# Patient Record
Sex: Male | Born: 1955 | Race: Black or African American | Hispanic: No | Smoking: Current every day smoker
Health system: Southern US, Community
[De-identification: ages and names within clinical notes are randomized; demographics above are authoritative.]

---

## 2006-12-07 ENCOUNTER — Emergency Department: Payer: Self-pay | Admitting: Emergency Medicine

## 2006-12-31 ENCOUNTER — Emergency Department: Payer: Self-pay | Admitting: Emergency Medicine

## 2008-10-22 IMAGING — CR DG WRIST COMPLETE 3+V*R*
1 series · 4 of 4 positions shown · non-contrast
Comparison: none

REASON FOR EXAM: direct blow at work on wrist
COMMENTS:

PROCEDURE:     DXR - DXR WRIST RT COMP WITH OBLIQUES  - December 07, 2006  [DATE]
RESULT:      Four views of the RIGHT wrist reveal the bones to be adequately
mineralized for age. I do not see evidence of an acute fracture. The
overlying soft tissues are normal in appearance.

[Series 1: view not recorded · 0.17mm/px · 4 of 4 slices shown]
[im 1/4]
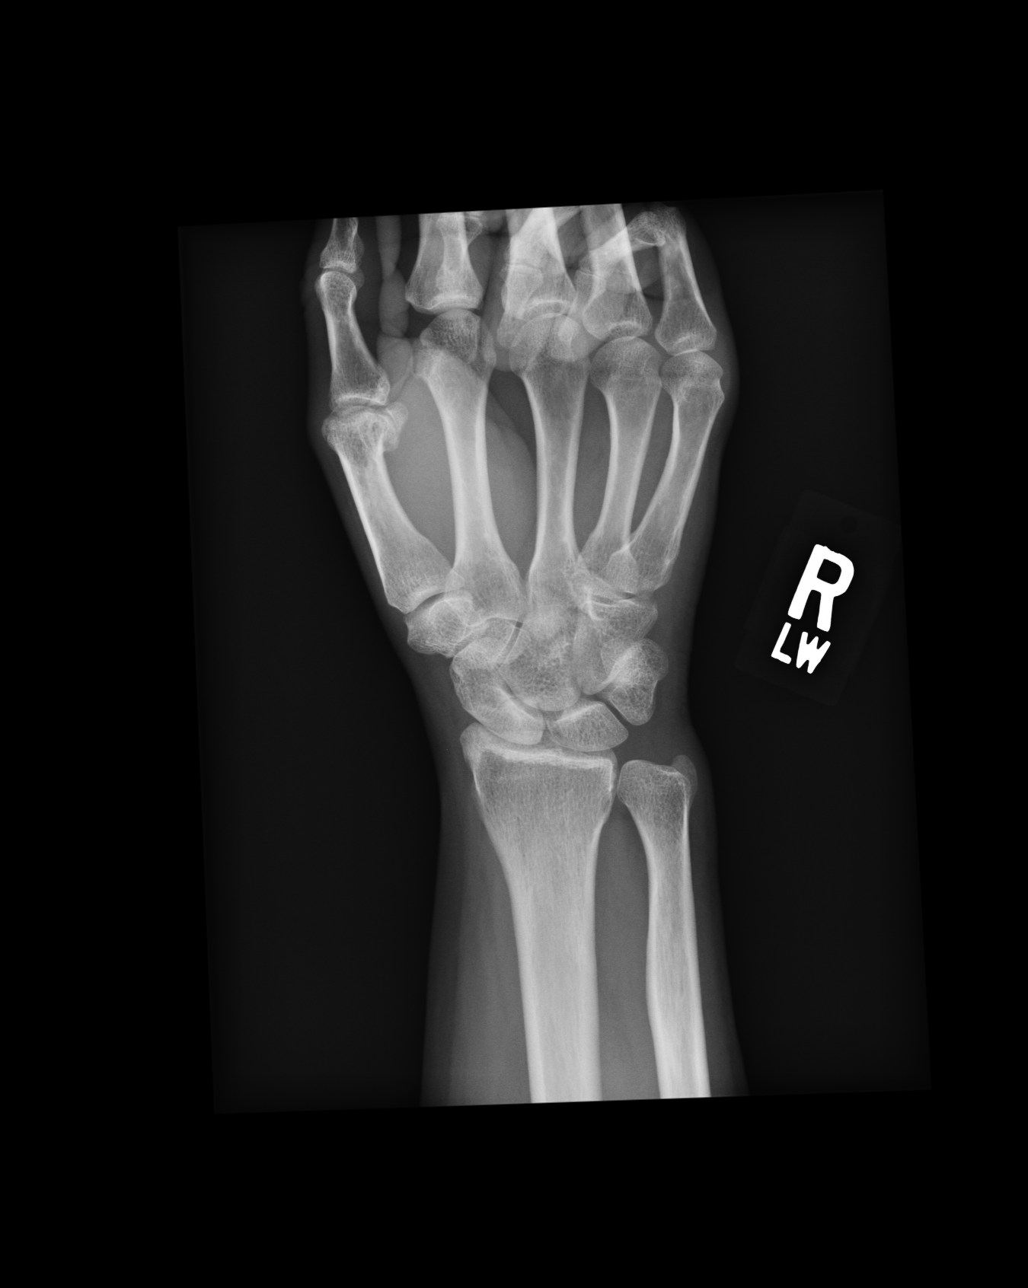
[im 2/4]
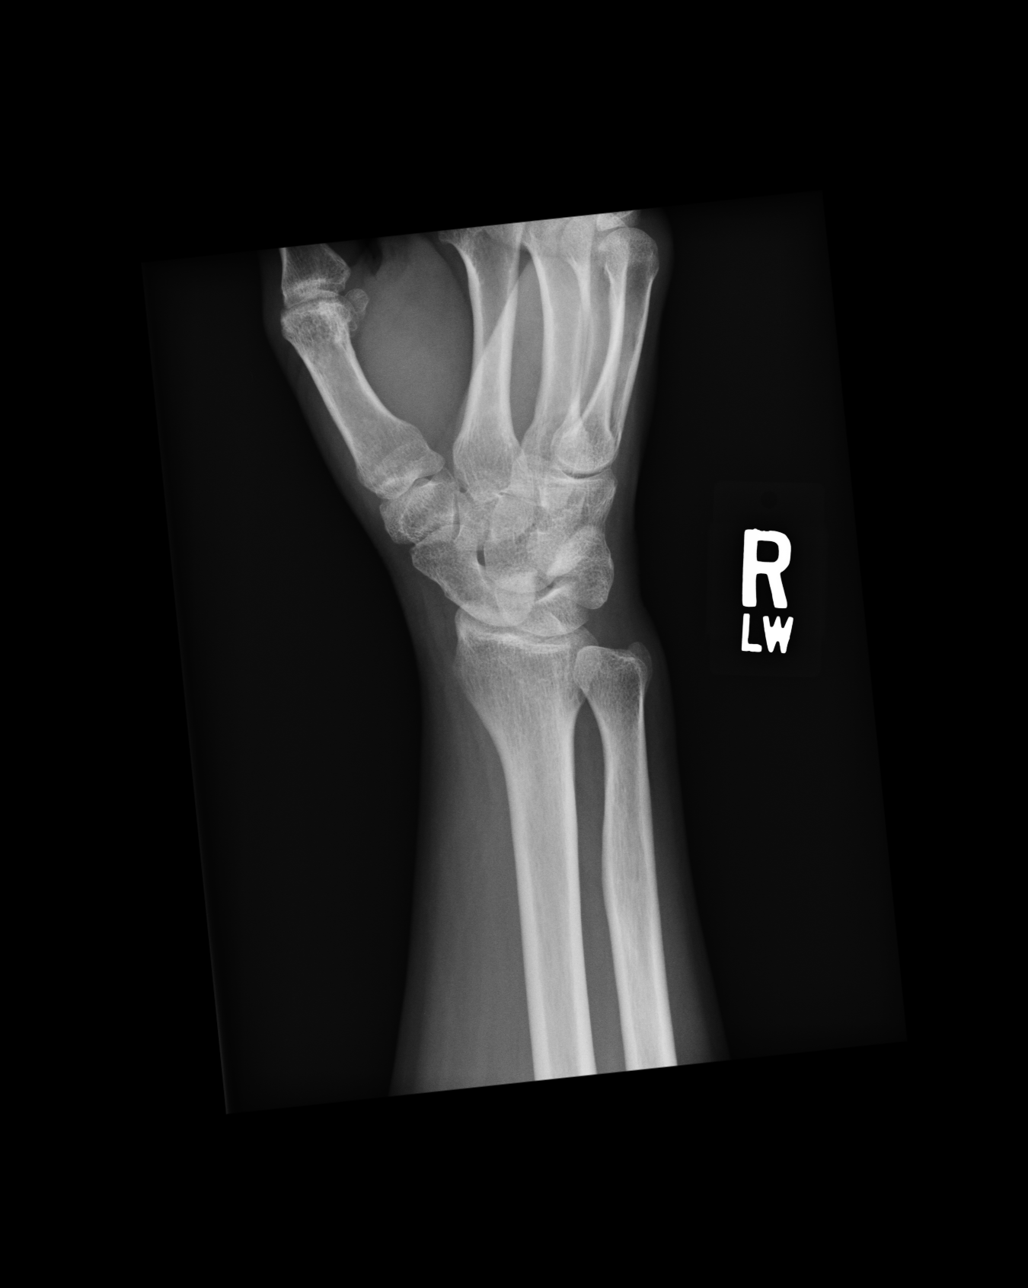
[im 3/4]
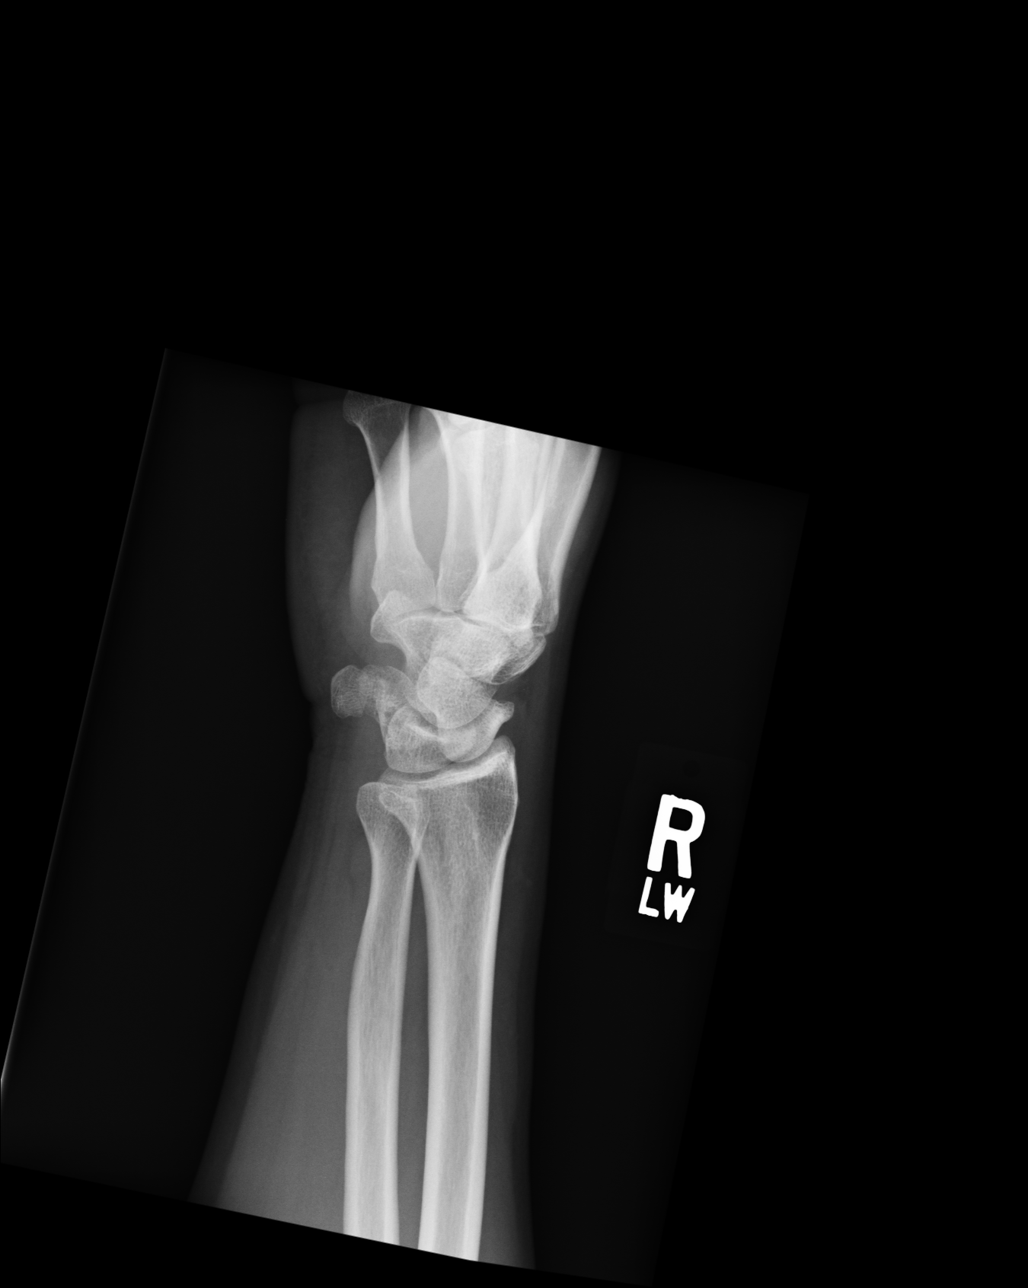
[im 4/4]
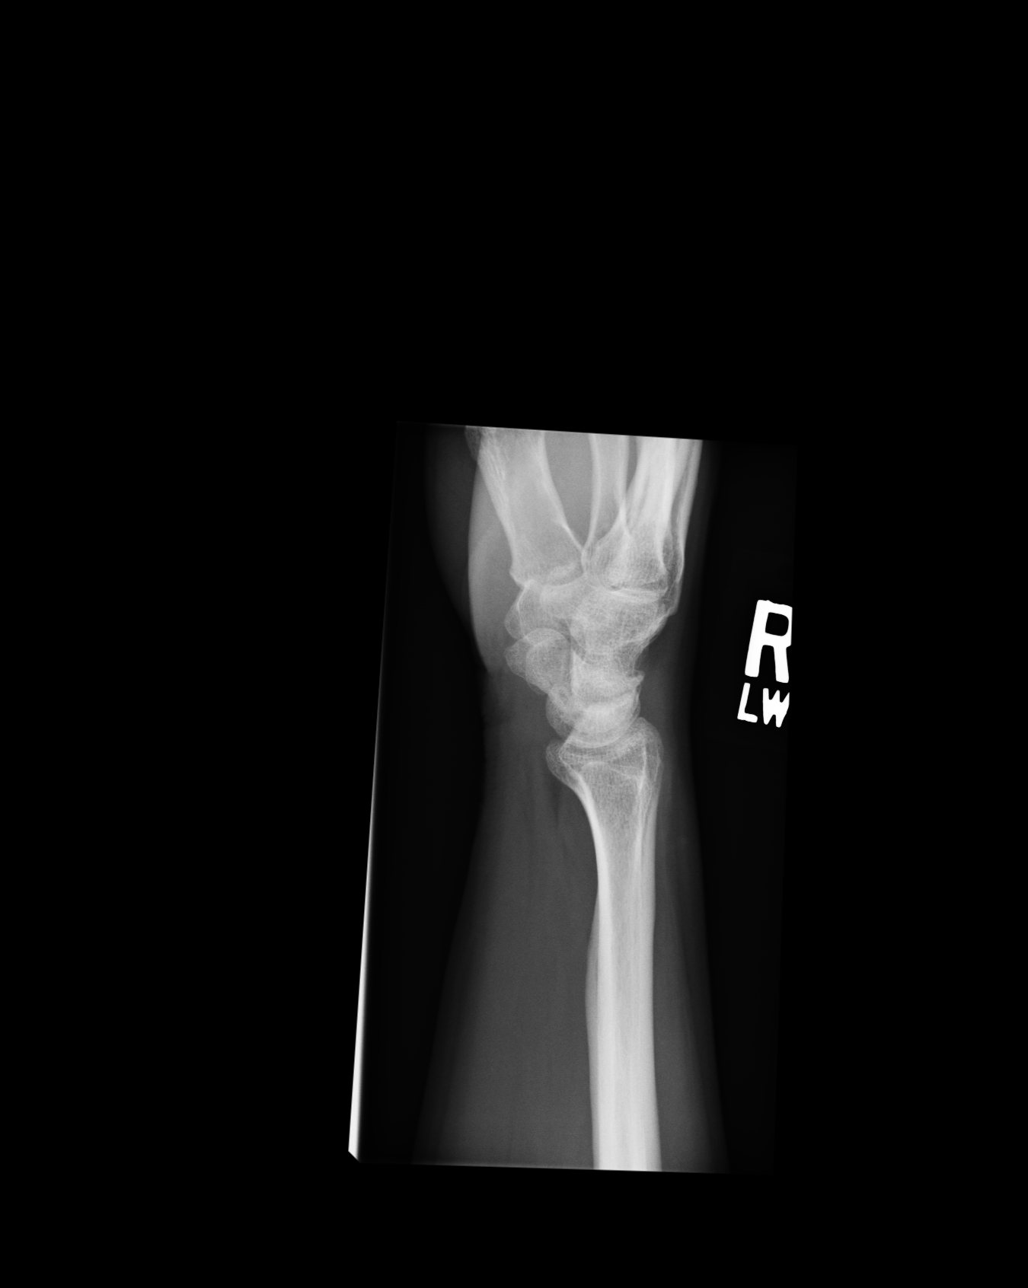

[4 of 4 positions shown; findings below may reference images not displayed]

IMPRESSION: I do not see objective evidence of an acute fracture of the RIGHT wrist.
Followup films are recommended if the patient has persistent symptoms in an
effort to detect periosteal reaction or bony resorption around an occult
fracture.

## 2012-02-14 ENCOUNTER — Emergency Department: Payer: Self-pay | Admitting: Unknown Physician Specialty

## 2012-11-04 ENCOUNTER — Emergency Department: Payer: Self-pay | Admitting: Emergency Medicine

## 2013-12-30 IMAGING — CR DG CHEST 2V
1 series · 2 of 2 positions shown · non-contrast
Comparison: none

REASON FOR EXAM: SOB & hemoptysis
COMMENTS:   May transport without cardiac monitor

PROCEDURE:     DXR - DXR CHEST PA (OR AP) AND LATERAL  - February 14, 2012  [DATE]
RESULT:     Comparison: None.

[Series 1: pa · 0.17mm/px · 2 of 2 slices shown]
[im 1/2]
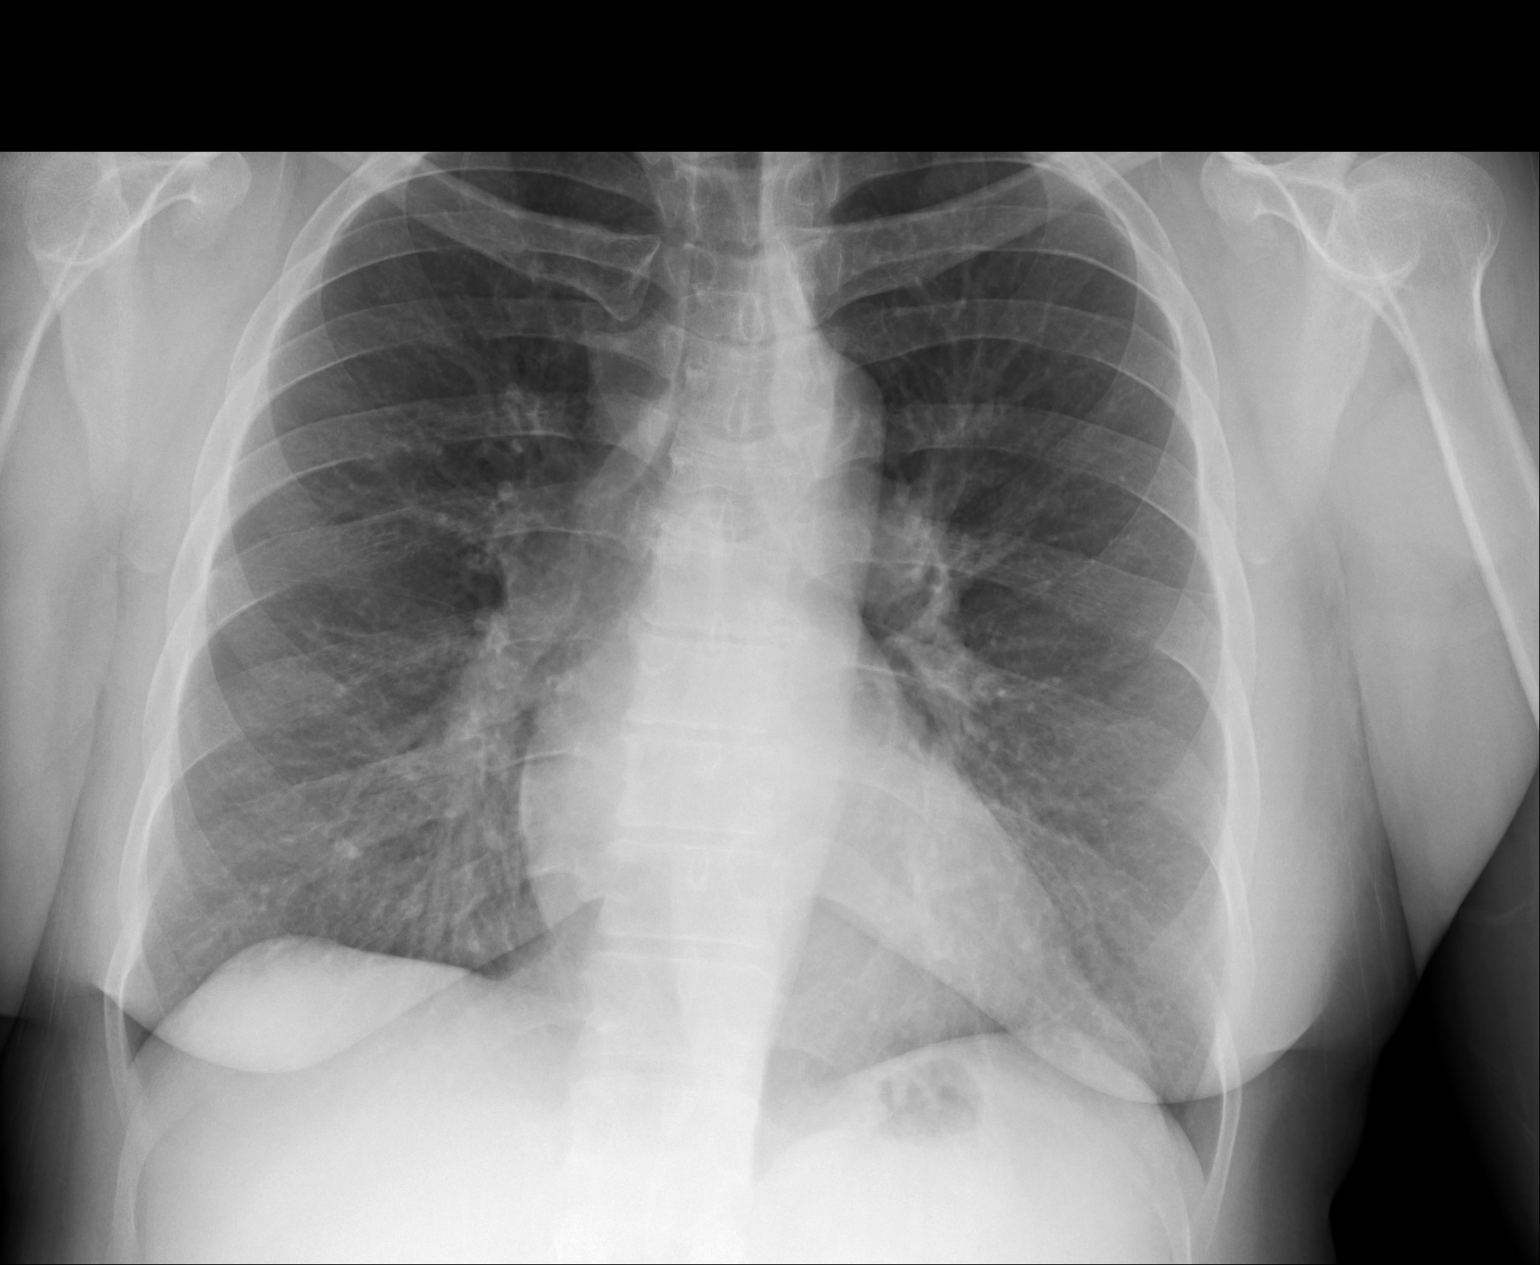
[im 2/2]
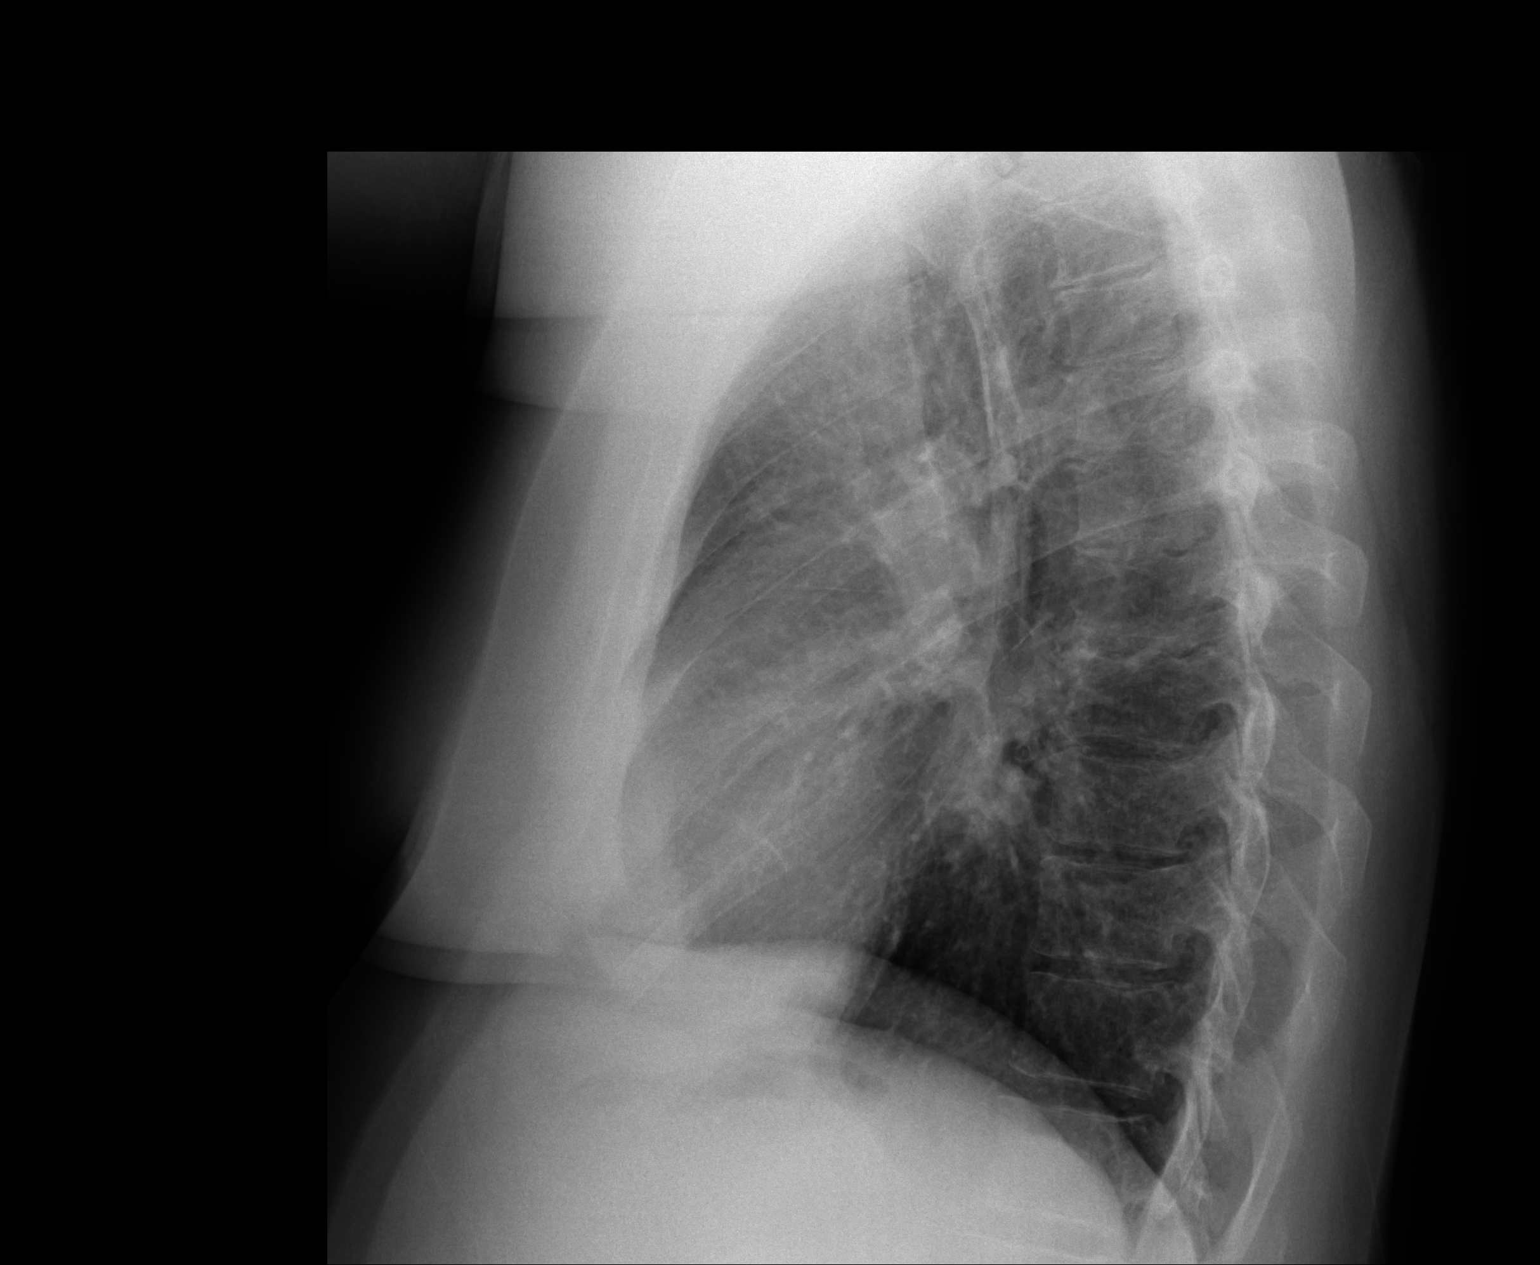

[2 of 2 positions shown; findings below may reference images not displayed]

FINDINGS: The heart and mediastinum are within normal limits, given patient rotation.
No focal pulmonary opacities. Infrahilar opacities on the lateral view are
felt to be related to pulmonary veins.
IMPRESSION: No acute cardiopulmonary disease.

[REDACTED]

## 2014-01-22 ENCOUNTER — Ambulatory Visit: Payer: Self-pay | Admitting: Internal Medicine

## 2014-04-10 ENCOUNTER — Ambulatory Visit (INDEPENDENT_AMBULATORY_CARE_PROVIDER_SITE_OTHER): Payer: BC Managed Care – PPO

## 2014-04-10 ENCOUNTER — Ambulatory Visit (INDEPENDENT_AMBULATORY_CARE_PROVIDER_SITE_OTHER): Payer: BC Managed Care – PPO | Admitting: Podiatry

## 2014-04-10 ENCOUNTER — Encounter: Payer: Self-pay | Admitting: Podiatry

## 2014-04-10 VITALS — BP 110/71 | HR 92 | Resp 16 | Ht 70.0 in | Wt 165.0 lb

## 2014-04-10 DIAGNOSIS — L84 Corns and callosities: Secondary | ICD-10-CM

## 2014-04-10 DIAGNOSIS — M79672 Pain in left foot: Secondary | ICD-10-CM

## 2014-04-10 DIAGNOSIS — M779 Enthesopathy, unspecified: Secondary | ICD-10-CM

## 2014-04-10 MED ORDER — TRIAMCINOLONE ACETONIDE 10 MG/ML IJ SUSP
10.0000 mg | Freq: Once | INTRAMUSCULAR | Status: AC
  Administered 2014-04-10: 10 mg

## 2014-04-10 NOTE — Progress Notes (Signed)
   Subjective:    Patient ID: Robert Benjamin, male    DOB: March 20, 1955, 59 y.o.   MRN: [id number]  HPI Comments: "I have pain on the bottom of this foot"  Patient c/o aching sub 5th MPJ left for few months. There is a callused area. He went to urgent care and the doc there did something that made it worse. He has tried the medicated disks-no help.  Foot Pain      Review of Systems  All other systems reviewed and are negative.      Objective:   Physical Exam        Assessment & Plan:

## 2014-04-11 NOTE — Progress Notes (Signed)
Subjective:     Patient ID: Robert Benjamin, male   DOB: [birth year], 59 y.o.   MRN: [id number]  HPI patient presents with severe pain underneath the left fifth metatarsal head that's been present for several months. Went to urgent care they did not help him and he has tried to trim them himself as best as possible. He does walk a lot with work and is on cement floors   Review of Systems  All other systems reviewed and are negative.      Objective:   Physical Exam  Constitutional: He is oriented to person, place, and time.  Cardiovascular: Intact distal pulses.   Musculoskeletal: Normal range of motion.  Neurological: He is oriented to person, place, and time.  Skin: Skin is warm.  Nursing note and vitals reviewed.  neurovascular status intact with muscle strength adequate and range of motion subtalar and midtarsal joint within normal limits. Patient's noted to have dry skin condition bilateral and does have small lucent lesions on the left fifth metatarsal that are very painful with pressed and also noted to have fluid buildup around the fifth MPJ. Patient has good digital perfusion and is well oriented 3     Assessment:     Inflammatory capsulitis with fluid buildup fifth MPJ left plantar along with lucent core lesions which are very painful when pressed    Plan:     H&P and x-rays reviewed condition discussed. Today I did a plantar capsule injection 2 mg dexamethasone Kenalog and I went ahead and did deep debridable of lesions which was tolerated well and he will be seen back when symptomatic again
